# Patient Record
Sex: Male | Born: 2000 | Race: White | Hispanic: No | Marital: Single | Smoking: Never smoker
Health system: Southern US, Community
[De-identification: ages and names within clinical notes are randomized; demographics above are authoritative.]

## PROBLEM LIST (undated history)

## (undated) DIAGNOSIS — F419 Anxiety disorder, unspecified: Secondary | ICD-10-CM

## (undated) DIAGNOSIS — T7840XA Allergy, unspecified, initial encounter: Secondary | ICD-10-CM

## (undated) DIAGNOSIS — J45909 Unspecified asthma, uncomplicated: Secondary | ICD-10-CM

## (undated) DIAGNOSIS — F32A Depression, unspecified: Secondary | ICD-10-CM

## (undated) HISTORY — DX: Anxiety disorder, unspecified: F41.9

## (undated) HISTORY — DX: Allergy, unspecified, initial encounter: T78.40XA

## (undated) HISTORY — DX: Unspecified asthma, uncomplicated: J45.909

## (undated) HISTORY — DX: Depression, unspecified: F32.A

---
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
## 2020-05-09 DIAGNOSIS — Z20828 Contact with and (suspected) exposure to other viral communicable diseases: Secondary | ICD-10-CM | POA: Diagnosis not present

## 2021-08-29 ENCOUNTER — Other Ambulatory Visit (HOSPITAL_COMMUNITY): Payer: Self-pay

## 2021-08-29 MED ORDER — COVID-19 AT HOME ANTIGEN TEST VI KIT
PACK | 0 refills | Status: DC
  Filled 2021-08-29: qty 4, 4d supply, fill #0

## 2021-11-09 ENCOUNTER — Other Ambulatory Visit (HOSPITAL_BASED_OUTPATIENT_CLINIC_OR_DEPARTMENT_OTHER): Payer: Self-pay

## 2022-04-16 ENCOUNTER — Ambulatory Visit: Payer: BLUE CROSS/BLUE SHIELD | Admitting: Family Medicine

## 2022-04-30 ENCOUNTER — Ambulatory Visit: Payer: Self-pay | Admitting: Family Medicine

## 2022-05-01 ENCOUNTER — Encounter: Payer: Self-pay | Admitting: Family Medicine

## 2022-05-01 ENCOUNTER — Ambulatory Visit (INDEPENDENT_AMBULATORY_CARE_PROVIDER_SITE_OTHER): Payer: 59 | Admitting: Family Medicine

## 2022-05-01 VITALS — BP 120/78 | HR 66 | Temp 97.4°F | Resp 16 | Ht 68.0 in | Wt 186.2 lb

## 2022-05-01 DIAGNOSIS — Z1159 Encounter for screening for other viral diseases: Secondary | ICD-10-CM

## 2022-05-01 DIAGNOSIS — Z0000 Encounter for general adult medical examination without abnormal findings: Secondary | ICD-10-CM

## 2022-05-01 DIAGNOSIS — Z114 Encounter for screening for human immunodeficiency virus [HIV]: Secondary | ICD-10-CM | POA: Diagnosis not present

## 2022-05-01 LAB — COMPREHENSIVE METABOLIC PANEL
ALT: 53 U/L (ref 0–53)
AST: 32 U/L (ref 0–37)
Albumin: 4.9 g/dL (ref 3.5–5.2)
Alkaline Phosphatase: 66 U/L (ref 39–117)
BUN: 13 mg/dL (ref 6–23)
CO2: 27 mEq/L (ref 19–32)
Calcium: 9.9 mg/dL (ref 8.4–10.5)
Chloride: 102 mEq/L (ref 96–112)
Creatinine, Ser: 1 mg/dL (ref 0.40–1.50)
GFR: 106.1 mL/min (ref >60.00)
Glucose, Bld: 80 mg/dL (ref 70–99)
Potassium: 4.3 mEq/L (ref 3.5–5.1)
Sodium: 140 mEq/L (ref 135–145)
Total Bilirubin: 0.5 mg/dL (ref 0.2–1.2)
Total Protein: 7.5 g/dL (ref 6.0–8.3)

## 2022-05-01 LAB — CBC WITH DIFFERENTIAL/PLATELET
Basophils Absolute: 0.1 10*3/uL (ref 0.0–0.1)
Basophils Relative: 0.9 % (ref 0.0–3.0)
Eosinophils Absolute: 0.2 10*3/uL (ref 0.0–0.7)
Eosinophils Relative: 4 % (ref 0.0–5.0)
HCT: 44.1 % (ref 39.0–52.0)
Hemoglobin: 15.3 g/dL (ref 13.0–17.0)
Lymphocytes Relative: 38 % (ref 12.0–46.0)
Lymphs Abs: 2.1 10*3/uL (ref 0.7–4.0)
MCHC: 34.6 g/dL (ref 30.0–36.0)
MCV: 91.6 fl (ref 78.0–100.0)
Monocytes Absolute: 0.5 10*3/uL (ref 0.1–1.0)
Monocytes Relative: 9 % (ref 3.0–12.0)
Neutro Abs: 2.6 10*3/uL (ref 1.4–7.7)
Neutrophils Relative %: 48.1 % (ref 43.0–77.0)
Platelets: 201 10*3/uL (ref 150.0–400.0)
RBC: 4.82 Mil/uL (ref 4.22–5.81)
RDW: 12.2 % (ref 11.5–15.5)
WBC: 5.5 10*3/uL (ref 4.0–10.5)

## 2022-05-01 LAB — LIPID PANEL
Cholesterol: 191 mg/dL (ref 0–200)
HDL: 53.4 mg/dL (ref >39.00)
LDL Cholesterol: 117 mg/dL — ABNORMAL HIGH (ref 0–99)
NonHDL: 137.64
Total CHOL/HDL Ratio: 4
Triglycerides: 103 mg/dL (ref 0.0–149.0)
VLDL: 20.6 mg/dL (ref 0.0–40.0)

## 2022-05-01 LAB — TSH: TSH: 2.24 u[IU]/mL (ref 0.35–5.50)

## 2022-05-01 NOTE — Progress Notes (Signed)
Complete physical exam  Patient: David Lawrence   DOB: [birth year]   21 y.o. Male  MRN: [id number]  Subjective:    Chief Complaint  Patient presents with   Annual Exam    David Lawrence is a 22 y.o. male who presents today for a complete physical exam and establish care. He reports consuming a general diet. The patient does not participate in regular exercise at present. He generally feels well. He reports sleeping fairly well. He does not have additional problems to discuss today.   States he will schedule a future appointment to discuss occasional headaches and joint pains.   Reports history of anxiety and depression, but has never been on medications. States mood is stable right now. No SI/HI.     Most recent fall risk assessment:    05/01/2022   10:38 AM  Fall Risk   Falls in the past year? 0  Number falls in past yr: 0  Injury with Fall? 0     Most recent depression screenings:    05/01/2022    9:18 AM  PHQ 2/9 Scores  PHQ - 2 Score 0  PHQ- 9 Score 8  Refuses treatment at this time. No SI/HI.   Vision:Not within last year , Dental: No current dental problems and Receives regular dental care, and STD: no concerns    Patient Care Team: Terrilyn Saver, NP as PCP - General (Family Medicine)   Outpatient Medications Prior to Visit  Medication Sig   [DISCONTINUED] COVID-19 At Home Antigen Test [facility] COVID-19 HOME TEST) KIT use as directed   No facility-administered medications prior to visit.    ROS All review of systems negative except what is listed in the HPI        Objective:     BP 120/78   Pulse 66   Temp (!) 97.4 F (36.3 C)   Resp 16   Ht 5\' 8"  (1.727 m)   Wt 186 lb 3.2 oz (84.5 kg)   SpO2 98%   BMI 28.31 kg/m    Physical Exam Vitals reviewed.  Constitutional:      General: He is not in acute distress.    Appearance: Normal appearance. He is not ill-appearing.  HENT:     Head: Normocephalic and atraumatic.     Right Ear:  Tympanic membrane normal.     Left Ear: Tympanic membrane normal.     Nose: Nose normal.     Mouth/Throat:     Mouth: Mucous membranes are moist.     Pharynx: Oropharynx is clear.  Eyes:     Extraocular Movements: Extraocular movements intact.     Conjunctiva/sclera: Conjunctivae normal.     Pupils: Pupils are equal, round, and reactive to light.  Neck:     Vascular: No carotid bruit.  Cardiovascular:     Rate and Rhythm: Normal rate and regular rhythm.     Pulses: Normal pulses.     Heart sounds: Normal heart sounds.  Pulmonary:     Effort: Pulmonary effort is normal.     Breath sounds: Normal breath sounds.  Abdominal:     General: Abdomen is flat. Bowel sounds are normal. There is no distension.     Palpations: Abdomen is soft. There is no mass.     Tenderness: There is no abdominal tenderness. There is no right CVA tenderness, left CVA tenderness, guarding or rebound.  Genitourinary:    Comments: Deferred exam Musculoskeletal:        General: Normal  range of motion.     Cervical back: Normal range of motion and neck supple. No tenderness.     Right lower leg: No edema.     Left lower leg: No edema.  Lymphadenopathy:     Cervical: No cervical adenopathy.  Skin:    General: Skin is warm and dry.     Capillary Refill: Capillary refill takes less than 2 seconds.  Neurological:     General: No focal deficit present.     Mental Status: He is alert and oriented to person, place, and time. Mental status is at baseline.  Psychiatric:        Mood and Affect: Mood normal.        Behavior: Behavior normal.        Thought Content: Thought content normal.        Judgment: Judgment normal.       No results found for any visits on 05/01/22.     Assessment & Plan:    Routine Health Maintenance and Physical Exam   There is no immunization history on file for this patient.  Health Maintenance  Topic Date Due   COVID-19 Vaccine (1) Never done   HPV VACCINES (1 - Male  2-dose series) Never done   Hepatitis C Screening  Never done   DTaP/Tdap/Td (1 - Tdap) Never done   INFLUENZA VACCINE  07/21/2022 (Originally 11/20/2021)   HIV Screening  Completed    Discussed health benefits of physical activity, and encouraged him to engage in regular exercise appropriate for his age and condition.  Problem List Items Addressed This Visit   None Visit Diagnoses     Annual physical exam    -  Primary   Relevant Orders   CBC with Differential/Platelet   Comprehensive metabolic panel   Lipid panel   TSH   Hepatitis C antibody   HIV Antibody (routine testing w rflx)   Encounter for screening for HIV       Relevant Orders   HIV Antibody (routine testing w rflx)   Encounter for hepatitis C screening test for low risk patient       Relevant Orders   Hepatitis C antibody      Return if symptoms worsen or fail to improve, for ; CPE 1 year.     Terrilyn Saver, NP

## 2022-05-01 NOTE — Patient Instructions (Signed)
Thank you for choosing [facility] at [facility] for your Primary Care needs. I am excited for the opportunity to partner with you to meet your health care goals. It was a pleasure meeting you today!   Information on diet, exercise, and health maintenance recommendations are listed below. This is information to help you be sure you are on track for optimal health and monitoring.   Please look over this and let us know if you have any questions or if you have completed any of the health maintenance outside of [facility] so that we can be sure your records are up to date.  ___________________________________________________________  MyChart:  For all urgent or time sensitive needs we ask that you please call the office to avoid delays. Our number is [phone]. MyChart is not constantly monitored and due to the large volume of messages a day, replies may take up to 72 business hours.  MyChart Policy: MyChart allows for you to see your visit notes, after visit summary, provider recommendations, lab and tests results, make an appointment, request refills, and contact your provider or the office for non-urgent questions or concerns. Providers are seeing patients during normal business hours and do not have built in time to review MyChart messages.  We ask that you allow a minimum of 3 business days for responses to MyChart messages. For this reason, please do not send urgent requests through MyChart. Please call the office at [phone]. New and ongoing conditions may require a visit. We have virtual and in-person visits available for your convenience.  Complex MyChart concerns may require a visit. Your provider may request you schedule a virtual or in-person visit to ensure we are providing the best care possible. MyChart messages sent after 11:00 AM on Friday will not be received by the provider until Monday morning.    Lab and Test Results: You will receive your lab and  test results on MyChart as soon as they are completed and results have been sent by the lab or testing facility. Due to this service, you will receive your results BEFORE your provider.  I review lab and test results each morning prior to seeing patients. Some results require collaboration with other providers to ensure you are receiving the most appropriate care. For this reason, we ask that you please allow a minimum of 3-5 business days from the time that ALL results have been received for your provider to receive and review lab and test results and contact you about these.  Most lab and test result comments from the provider will be sent through MyChart. Your provider may recommend changes to the plan of care, follow-up visits, repeat testing, ask questions, or request an office visit to discuss these results. You may reply directly to this message or call the office to provide information for the provider or set up an appointment. In some instances, you will be called with test results and recommendations. Please let us know if this is preferred and we will make note of this in your chart to provide this for you.    If you have not heard a response to your lab or test results in 5 business days from all results returning to MyChart, please call the office to let us know. We ask that you please avoid calling prior to this time unless there is an emergent concern. Due to high call volumes, this can delay the resulting process.  After Hours: For all non-emergency after hours needs,   please call the office at [phone] and select the option to reach the on-call  service. On-call services are shared between multiple  offices and therefore it will not be possible to speak directly with your provider. On-call providers may provide medical advice and recommendations, but are unable to provide refills for maintenance medications.  For all emergency or urgent medical needs after normal business  hours, we recommend that you seek care at the closest Urgent Care or Emergency Department to ensure appropriate treatment in a timely manner.  [facility] has a 24 hour emergency room located on the ground floor for your convenience.   Urgent Concerns During the Business Day Providers are seeing patients from 8AM to 5PM with a busy schedule and are most often not able to respond to non-urgent calls until the end of the day or the next business day. If you should have URGENT concerns during the day, please call and speak to the nurse or schedule a same day appointment so that we can address your concern without delay.   Thank you, again, for choosing me as your health care partner. I appreciate your trust and look forward to learning more about you.   Jinx Gilden B. Jeter Tomey, DNP, FNP-C  ___________________________________________________________  Health Maintenance Recommendations Screening Testing Mammogram Every 1-2 years based on history and risk factors Starting at age 50 Pap Smear Ages 21-39 every 3 years Ages 30-65 every 5 years with HPV testing More frequent testing may be required based on results and history Colon Cancer Screening Every 1-10 years based on test performed, risk factors, and history Starting at age 45 Bone Density Screening Every 2-10 years based on history Starting at age 65 for women Recommendations for men differ based on medication usage, history, and risk factors AAA Screening One time ultrasound Men 65-75 years old who have ever smoked Lung Cancer Screening Low Dose Lung CT every 12 months Age 50-80 years with a 20 pack-year smoking history who still smoke or who have quit within the last 15 years  Screening Labs Routine  Labs: Complete Blood Count (CBC), Complete Metabolic Panel (CMP), Cholesterol (Lipid Panel) Every 6-12 months based on history and medications May be recommended more frequently based on current conditions or  previous results Hemoglobin A1c Lab Every 3-12 months based on history and previous results Starting at age 45 or earlier with diagnosis of diabetes, high cholesterol, BMI >26, and/or risk factors Frequent monitoring for patients with diabetes to ensure blood sugar control Thyroid Panel (TSH w/ T3 & T4) Every 6 months based on history, symptoms, and risk factors May be repeated more often if on medication HIV One time testing for all patients 13 and older May be repeated more frequently for patients with increased risk factors or exposure Hepatitis C One time testing for all patients 18 and older May be repeated more frequently for patients with increased risk factors or exposure Gonorrhea, Chlamydia Every 12 months for all sexually active persons 13-24 years Additional monitoring may be recommended for those who are considered high risk or who have symptoms PSA Men 40-54 years old with risk factors Additional screening may be recommended from age 55-69 based on risk factors, symptoms, and history  Vaccine Recommendations Tetanus Booster All adults every 10 years Flu Vaccine All patients 6 months and older every year COVID Vaccine All patients 12 years and older Initial dosing with booster May recommend additional booster based on age and health history HPV Vaccine 2 doses all patients   age 9-26 Dosing may be considered for patients over 26 Shingles Vaccine (Shingrix) 2 doses all adults 50 years and older Pneumonia (Pneumovax 23) All adults 65 years and older May recommend earlier dosing based on health history Pneumonia (Prevnar 13) All adults 65 years and older Dosed 1 year after Pneumovax 23 Pneumonia (Prevnar 20) All adults 65 years and older (adults 19-64 with certain conditions or risk factors) 1 dose  For those who have no received Prevnar 13 vaccine previously   Additional Screening, Testing, and Vaccinations may be recommended on an individualized basis based on  family history, health history, risk factors, and/or exposure.  __________________________________________________________  Diet Recommendations for All Patients  I recommend that all patients maintain a diet low in saturated fats, carbohydrates, and cholesterol. While this can be challenging at first, it is not impossible and small changes can make big differences.  Things to try: Decreasing the amount of soda, sweet tea, and/or juice to one or less per day and replace with water While water is always the first choice, if you do not like water you may consider adding a water additive without sugar to improve the taste other sugar free drinks Replace potatoes with a brightly colored vegetable at dinner Use healthy oils, such as canola oil or olive oil, instead of butter or hard margarine Limit your bread intake to two pieces or less a day Replace regular pasta with low carb pasta options Bake, broil, or grill foods instead of frying Monitor portion sizes  Eat smaller, more frequent meals throughout the day instead of large meals  An important thing to remember is, if you love foods that are not great for your health, you don't have to give them up completely. Instead, allow these foods to be a reward when you have done well. Allowing yourself to still have special treats every once in a while is a nice way to tell yourself thank you for working hard to keep yourself healthy.   Also remember that every day is a new day. If you have a bad day and "fall off the wagon", you can still climb right back up and keep moving along on your journey!  We have resources available to help you!  Some websites that may be helpful include: www.MyPlate.gov  Www.VeryWellFit.com _____________________________________________________________  Activity Recommendations for All Patients  I recommend that all adults get at least 20 minutes of moderate physical activity that elevates your heart rate at least 5  days out of the week.  Some examples include: Walking or jogging at a pace that allows you to carry on a conversation Cycling (stationary bike or outdoors) Water aerobics Yoga Weight lifting Dancing If physical limitations prevent you from putting stress on your joints, exercise in a pool or seated in a chair are excellent options.  Do determine your MAXIMUM heart rate for activity: 220 - YOUR AGE = MAX Heart Rate   Remember! Do not push yourself too hard.  Start slowly and build up your pace, speed, weight, time in exercise, etc.  Allow your body to rest between exercise and get good sleep. You will need more water than normal when you are exerting yourself. Do not wait until you are thirsty to drink. Drink with a purpose of getting in at least 8, 8 ounce glasses of water a day plus more depending on how much you exercise and sweat.    If you begin to develop dizziness, chest pain, abdominal pain, jaw pain, shortness of breath, headache,   vision changes, lightheadedness, or other concerning symptoms, stop the activity and allow your body to rest. If your symptoms are severe, seek emergency evaluation immediately. If your symptoms are concerning, but not severe, please let us know so that we can recommend further evaluation.     

## 2022-05-02 LAB — HIV ANTIBODY (ROUTINE TESTING W REFLEX): HIV 1&2 Ab, 4th Generation: NONREACTIVE

## 2022-05-02 LAB — HEPATITIS C ANTIBODY: Hepatitis C Ab: NONREACTIVE

## 2022-06-28 ENCOUNTER — Other Ambulatory Visit (HOSPITAL_COMMUNITY): Payer: Self-pay

## 2022-06-28 ENCOUNTER — Other Ambulatory Visit: Payer: Self-pay

## 2022-06-28 MED ORDER — AMOXICILLIN 500 MG PO CAPS
500.0000 mg | ORAL_CAPSULE | Freq: Three times a day (TID) | ORAL | 0 refills | Status: DC
  Filled 2022-06-28: qty 15, 5d supply, fill #0

## 2022-06-28 MED ORDER — LORAZEPAM 2 MG PO TABS
2.0000 mg | ORAL_TABLET | Freq: Once | ORAL | 0 refills | Status: AC
  Filled 2022-06-28: qty 1, 1d supply, fill #0

## 2022-12-30 ENCOUNTER — Ambulatory Visit (HOSPITAL_BASED_OUTPATIENT_CLINIC_OR_DEPARTMENT_OTHER)
Admission: RE | Admit: 2022-12-30 | Discharge: 2022-12-30 | Disposition: A | Discharge status: Home / Self Care | Payer: PRIVATE HEALTH INSURANCE | Source: Ambulatory Visit | Attending: Physician Assistant | Admitting: Physician Assistant

## 2022-12-30 ENCOUNTER — Ambulatory Visit (INDEPENDENT_AMBULATORY_CARE_PROVIDER_SITE_OTHER): Payer: PRIVATE HEALTH INSURANCE | Admitting: Physician Assistant

## 2022-12-30 ENCOUNTER — Other Ambulatory Visit (HOSPITAL_BASED_OUTPATIENT_CLINIC_OR_DEPARTMENT_OTHER): Payer: Self-pay

## 2022-12-30 ENCOUNTER — Encounter: Payer: Self-pay | Admitting: Physician Assistant

## 2022-12-30 VITALS — BP 132/84 | HR 71 | Temp 98.2°F | Resp 18 | Ht 69.0 in | Wt 175.4 lb

## 2022-12-30 DIAGNOSIS — M5442 Lumbago with sciatica, left side: Secondary | ICD-10-CM | POA: Insufficient documentation

## 2022-12-30 DIAGNOSIS — G8929 Other chronic pain: Secondary | ICD-10-CM | POA: Insufficient documentation

## 2022-12-30 DIAGNOSIS — M5441 Lumbago with sciatica, right side: Secondary | ICD-10-CM | POA: Insufficient documentation

## 2022-12-30 DIAGNOSIS — R112 Nausea with vomiting, unspecified: Secondary | ICD-10-CM

## 2022-12-30 DIAGNOSIS — R202 Paresthesia of skin: Secondary | ICD-10-CM

## 2022-12-30 MED ORDER — OMEPRAZOLE 20 MG PO CPDR
20.0000 mg | DELAYED_RELEASE_CAPSULE | Freq: Every day | ORAL | 1 refills | Status: AC
  Filled 2022-12-30: qty 30, 30d supply, fill #0

## 2022-12-30 NOTE — Progress Notes (Signed)
Established patient visit   Patient: David Lawrence   DOB: [birth year]   22 y.o. Male  MRN: [id number] Visit Date: 12/30/2022  Today's healthcare provider: Alfredia Ferguson, PA-C   Chief Complaint  Patient presents with   Abdominal Pain    Times 2 weeks with nausea and vomiting time 1 yesterday   Nausea   Headache   Back Pain    Times one year off and on   Subjective    Abdominal Pain Associated symptoms include headaches. Pertinent negatives include no fever.  Headache  Associated symptoms include abdominal pain and back pain. Pertinent negatives include no coughing, dizziness or fever.  Back Pain Associated symptoms include abdominal pain and headaches. Pertinent negatives include no chest pain or fever.   Patient reports two weeks of nausea, 'weak stomach' with one episode of vomiting yesterday. He reports it occurs mainly in the morning and midday. Denies associated abdominal pain or BM changes.  He reports headache yesterday with a minor runny nose.  Pt reports 2-3 years + of intermittent low back pain, can radiate down b/l legs. Becoming more frequent, and now associated with numbness/paresthesias when standing. Reports his legs feel heavy and like they are falling asleep after around 30 minutes of standing. Denies injury, he used to be a Air traffic controller for years.  Medications: Outpatient Medications Prior to Visit  Medication Sig   [DISCONTINUED] amoxicillin (AMOXIL) 500 MG capsule Take 1 capsule (500 mg total) by mouth 3 (three) times daily until gone.   No facility-administered medications prior to visit.    Review of Systems  Constitutional:  Negative for fatigue and fever.  Respiratory:  Negative for cough and shortness of breath.   Cardiovascular:  Negative for chest pain, palpitations and leg swelling.  Gastrointestinal:  Positive for abdominal pain.  Musculoskeletal:  Positive for back pain.  Neurological:  Positive for headaches. Negative for  dizziness.      Objective    BP 132/84 (BP Location: Left Arm, Patient Position: Sitting, Cuff Size: Normal)   Pulse 71   Temp 98.2 F (36.8 C) (Oral)   Resp 18   Ht 5\' 9"  (1.753 m)   Wt 175 lb 6.4 oz (79.6 kg)   SpO2 97%   BMI 25.90 kg/m   Physical Exam Constitutional:      General: He is awake.     Appearance: He is well-developed.  HENT:     Head: Normocephalic.  Eyes:     Conjunctiva/sclera: Conjunctivae normal.  Cardiovascular:     Rate and Rhythm: Normal rate and regular rhythm.     Heart sounds: Normal heart sounds.  Pulmonary:     Effort: Pulmonary effort is normal.     Breath sounds: Normal breath sounds.  Abdominal:     Palpations: Abdomen is soft.     Tenderness: There is no abdominal tenderness.  Skin:    General: Skin is warm.  Neurological:     Mental Status: He is alert and oriented to person, place, and time.  Psychiatric:        Attention and Perception: Attention normal.        Mood and Affect: Mood normal.        Speech: Speech normal.        Behavior: Behavior is cooperative.      No results found for any visits on 12/30/22.  Assessment & Plan     1. Nausea and vomiting, unspecified vomiting type Tx was gerd, rec pepcid  20 mg bid and starting omeprazole in AM on empty stomach for ~ 6-8 weeks.   - Lipase - Comp Met (CMET) - CBC w/Diff - omeprazole (PRILOSEC) 20 MG capsule; Take 1 capsule (20 mg total) by mouth daily.  Dispense: 90 capsule; Refill: 1  2. Chronic bilateral low back pain with bilateral sciatica 3. Paresthesia of both lower extremities Given paresthesias, ordering xrays, and will refer to ortho vs pt based on imaging.  - DG Lumbar Spine Complete; Future   Return if symptoms worsen or fail to improve.      I, Alfredia Ferguson, PA-C have reviewed all documentation for this visit. The documentation on  12/30/22   for the exam, diagnosis, procedures, and orders are all accurate and complete.    Alfredia Ferguson, PA-C  [facility] [phone] (phone) [phone] (fax)  [facility]

## 2022-12-31 LAB — CBC WITH DIFFERENTIAL/PLATELET
Basophils Absolute: 0.1 10*3/uL (ref 0.0–0.1)
Basophils Relative: 0.8 % (ref 0.0–3.0)
Eosinophils Absolute: 0.1 10*3/uL (ref 0.0–0.7)
Eosinophils Relative: 1.4 % (ref 0.0–5.0)
HCT: 47.6 % (ref 39.0–52.0)
Hemoglobin: 15.9 g/dL (ref 13.0–17.0)
Lymphocytes Relative: 29.7 % (ref 12.0–46.0)
Lymphs Abs: 2 10*3/uL (ref 0.7–4.0)
MCHC: 33.5 g/dL (ref 30.0–36.0)
MCV: 93.1 fl (ref 78.0–100.0)
Monocytes Absolute: 0.5 10*3/uL (ref 0.1–1.0)
Monocytes Relative: 7.4 % (ref 3.0–12.0)
Neutro Abs: 4.2 10*3/uL (ref 1.4–7.7)
Neutrophils Relative %: 60.7 % (ref 43.0–77.0)
Platelets: 232 10*3/uL (ref 150.0–400.0)
RBC: 5.11 Mil/uL (ref 4.22–5.81)
RDW: 12.5 % (ref 11.5–15.5)
WBC: 6.8 10*3/uL (ref 4.0–10.5)

## 2022-12-31 LAB — COMPREHENSIVE METABOLIC PANEL
ALT: 17 U/L (ref 0–53)
AST: 17 U/L (ref 0–37)
Albumin: 4.9 g/dL (ref 3.5–5.2)
Alkaline Phosphatase: 63 U/L (ref 39–117)
BUN: 11 mg/dL (ref 6–23)
CO2: 31 meq/L (ref 19–32)
Calcium: 10.6 mg/dL — ABNORMAL HIGH (ref 8.4–10.5)
Chloride: 101 meq/L (ref 96–112)
Creatinine, Ser: 1 mg/dL (ref 0.40–1.50)
GFR: 105.6 mL/min (ref >60.00)
Glucose, Bld: 81 mg/dL (ref 70–99)
Potassium: 4.5 meq/L (ref 3.5–5.1)
Sodium: 139 meq/L (ref 135–145)
Total Bilirubin: 1.1 mg/dL (ref 0.2–1.2)
Total Protein: 7.8 g/dL (ref 6.0–8.3)

## 2022-12-31 LAB — LIPASE: Lipase: 13 U/L (ref 11.0–59.0)

## 2023-01-06 ENCOUNTER — Other Ambulatory Visit: Payer: Self-pay | Admitting: Physician Assistant

## 2023-01-06 DIAGNOSIS — G8929 Other chronic pain: Secondary | ICD-10-CM

## 2023-01-06 DIAGNOSIS — R202 Paresthesia of skin: Secondary | ICD-10-CM

## 2023-01-07 ENCOUNTER — Telehealth: Payer: Self-pay | Admitting: Family Medicine

## 2023-01-07 NOTE — Telephone Encounter (Signed)
Pt would like his referral changed to [facility] off [address] in [place].

## 2023-01-07 NOTE — Telephone Encounter (Signed)
Order changed.

## 2023-01-07 NOTE — Addendum Note (Signed)
Addended bySilvio Pate on: 01/07/2023 10:52 AM   Modules accepted: Orders

## 2023-01-08 ENCOUNTER — Ambulatory Visit: Payer: PRIVATE HEALTH INSURANCE | Admitting: Physical Medicine and Rehabilitation

## 2023-01-09 ENCOUNTER — Other Ambulatory Visit (HOSPITAL_BASED_OUTPATIENT_CLINIC_OR_DEPARTMENT_OTHER): Payer: Self-pay
